# Patient Record
Sex: Male | Born: 2005 | Race: White | Hispanic: No | Marital: Single | Smoking: Never smoker
Health system: Southern US, Community
[De-identification: ages and names within clinical notes are randomized; demographics above are authoritative.]

---
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
## 2024-06-01 ENCOUNTER — Other Ambulatory Visit: Payer: Self-pay

## 2024-06-01 ENCOUNTER — Emergency Department
Admission: EM | Admit: 2024-06-01 | Discharge: 2024-06-01 | Disposition: A | Discharge status: Home / Self Care | Attending: Emergency Medicine | Admitting: Emergency Medicine

## 2024-06-01 DIAGNOSIS — Z041 Encounter for examination and observation following transport accident: Secondary | ICD-10-CM | POA: Insufficient documentation

## 2024-06-01 DIAGNOSIS — Y92410 Unspecified street and highway as the place of occurrence of the external cause: Secondary | ICD-10-CM | POA: Diagnosis not present
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
